# Patient Record
Sex: Male | Born: 1990 | Race: White | Hispanic: No | Marital: Single | Smoking: Never smoker
Health system: Southern US, Community
[De-identification: ages and names within clinical notes are randomized; demographics above are authoritative.]

## PROBLEM LIST (undated history)

## (undated) DIAGNOSIS — F909 Attention-deficit hyperactivity disorder, unspecified type: Secondary | ICD-10-CM

## (undated) HISTORY — PX: SHOULDER SURGERY: SHX246

---
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
## 2010-12-31 NOTE — Op Note (Signed)
NAMESASAN, WILKIE               ACCOUNT NO.:  [id number]   MEDICAL RECORD NO.:  [id number]          PATIENT TYPE:  AMB   LOCATION:  DSC                          FACILITY:  [facility]   PHYSICIAN:  Loreta Ave, M.D. DATE OF BIRTH:  Nov 22, 1990   DATE OF PROCEDURE:  08/17/2006  DATE OF DISCHARGE:  08/18/2006                               OPERATIVE REPORT   PREOPERATIVE DIAGNOSIS:  Right shoulder dislocation with anterior  instability and labral tear.   POSTOPERATIVE DIAGNOSIS:  Right shoulder dislocation with anterior  instability and labral tear, with anterior labrum tear, a chondral  impaction injury, chondral loose bodies and Bankart lesion.   PROCEDURE PERFORMED:  Right shoulder examination under anesthesia,  arthroscopy with debridement of labrum, removal of loose bodies and  chondroplasty of glenoid.  Reconstruction Bankart-type, with FiberWire  suture x2 and bioabsorbable knotless anchors x2.   SURGEON:  Loreta Ave, M.D.   ASSISTANT:  Lestine Mount, P.A., present throughout the entire case.   ANESTHESIA:  General.   ESTIMATED BLOOD LOSS:  Minimal.   SPECIMENS:  None.   CULTURES:  None.   COMPLICATIONS:  None.   DRESSINGS:  Soft compressive with shoulder immobilizer.   DESCRIPTION OF PROCEDURE:  The patient was brought to the operating room  and after adequate anesthesia had been obtained, both shoulders  examined.  On the right, he has anterior and anteroinferior instability,  moderate, not marked.  Increased external rotation.  Stable posteriorly.  Placed in a beach chair positioner on a shoulder positioner, prepped and  draped in the usual sterile fashion.  Anterior and posterior  arthroscopic portals.  Arthroscope introduced, shoulder distended and  inspected.  Rotator cuff intact with a little bit of irritation fraying  on the bottom side of the infraspinatus, debrided.  No significant  tears.  Biceps tendon, biceps anchor intact.  Front of the labrum  and  glenoid had obvious anterior vent with a Bankart lesion from 3 to 6  o'clock position, extending about a centimeter down the neck of the  glenoid.  Marked tearing and fraying, complex in nature, of the labrum,  all debrided back to a stable surface.  An impaction injury on the  articular cartilage of the glenoid, debrided, very focal, very small,  but chondral loose bodies removed.  Vast majority of articular cartilage  intact.  The front of the glenoid area was then roughened.  With the  Arthrex FiberLoop instrumentation, I then mobilized the capsule and  placed 2 FiberWire loops in the capsular labral interface, 1 at the 5  o'clock position and the other at the 3 o'clock position.  They were  then firmly fixed to the glenoid utilizing the bioabsorbable knotless  anchor system with the anchors drilled in the glenoid at the 5 and 3  o'clock position.  Loops attached to these and then firmly buried,  giving a nice, firm repair of the Bankart lesion back to the anatomic  position.  The entire shoulder re-examined with no other  significant findings appreciated.  Instruments and fluid removed.  Portals were closed with nylon.  Sterile  compressive dressing applied.  Shoulder immobilizer applied.  Anesthesia reversed.  Brought to recovery  room.  Tolerated the surgery well, no complications.      Loreta Ave, M.D.  Electronically Signed     DFM/MEDQ  D:  08/18/2006  T:  08/19/2006  Job:  [id number]

## 2021-01-04 ENCOUNTER — Other Ambulatory Visit: Payer: Self-pay

## 2021-01-04 ENCOUNTER — Emergency Department (HOSPITAL_BASED_OUTPATIENT_CLINIC_OR_DEPARTMENT_OTHER): Payer: BC Managed Care – PPO | Admitting: Radiology

## 2021-01-04 ENCOUNTER — Emergency Department (HOSPITAL_BASED_OUTPATIENT_CLINIC_OR_DEPARTMENT_OTHER)
Admission: EM | Admit: 2021-01-04 | Discharge: 2021-01-04 | Disposition: A | Discharge status: Home / Self Care | Payer: BC Managed Care – PPO | Attending: Emergency Medicine | Admitting: Emergency Medicine

## 2021-01-04 ENCOUNTER — Encounter (HOSPITAL_BASED_OUTPATIENT_CLINIC_OR_DEPARTMENT_OTHER): Payer: Self-pay | Admitting: Obstetrics and Gynecology

## 2021-01-04 DIAGNOSIS — W25XXXA Contact with sharp glass, initial encounter: Secondary | ICD-10-CM | POA: Insufficient documentation

## 2021-01-04 DIAGNOSIS — S51811A Laceration without foreign body of right forearm, initial encounter: Secondary | ICD-10-CM | POA: Diagnosis not present

## 2021-01-04 DIAGNOSIS — S59911A Unspecified injury of right forearm, initial encounter: Secondary | ICD-10-CM | POA: Diagnosis present

## 2021-01-04 HISTORY — DX: Attention-deficit hyperactivity disorder, unspecified type: F90.9

## 2021-01-04 MED ORDER — LIDOCAINE-EPINEPHRINE (PF) 2 %-1:200000 IJ SOLN
INTRAMUSCULAR | Status: AC
  Administered 2021-01-04: 20 mL via INTRADERMAL
  Filled 2021-01-04: qty 20

## 2021-01-04 MED ORDER — LIDOCAINE-EPINEPHRINE (PF) 2 %-1:200000 IJ SOLN: 20.0000 mL | Freq: Once | INTRAMUSCULAR | Status: AC

## 2021-01-04 MED ORDER — CEPHALEXIN 500 MG PO CAPS: 500.0000 mg | ORAL_CAPSULE | Freq: Four times a day (QID) | ORAL | 0 refills | Status: AC

## 2021-01-04 MED ORDER — CEPHALEXIN 250 MG PO CAPS
1000.0000 mg | ORAL_CAPSULE | Freq: Once | ORAL | Status: AC
  Administered 2021-01-04: 1000 mg via ORAL
  Filled 2021-01-04: qty 4
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
## 2022-05-26 ENCOUNTER — Other Ambulatory Visit (HOSPITAL_COMMUNITY): Payer: Self-pay

## 2022-05-26 MED ORDER — AMPHETAMINE-DEXTROAMPHETAMINE 20 MG PO TABS
20.0000 mg | ORAL_TABLET | Freq: Two times a day (BID) | ORAL | 0 refills | Status: DC
  Filled 2022-05-26: qty 60, 30d supply, fill #0

## 2022-06-27 ENCOUNTER — Other Ambulatory Visit (HOSPITAL_COMMUNITY): Payer: Self-pay

## 2022-06-27 MED ORDER — AMPHETAMINE-DEXTROAMPHETAMINE 20 MG PO TABS
20.0000 mg | ORAL_TABLET | Freq: Two times a day (BID) | ORAL | 0 refills | Status: AC
  Filled 2022-06-27 – 2022-06-28 (×2): qty 60, 30d supply, fill #0

## 2022-06-28 ENCOUNTER — Other Ambulatory Visit (HOSPITAL_COMMUNITY): Payer: Self-pay
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
## 2022-10-05 ENCOUNTER — Emergency Department (HOSPITAL_BASED_OUTPATIENT_CLINIC_OR_DEPARTMENT_OTHER)
Admission: EM | Admit: 2022-10-05 | Discharge: 2022-10-05 | Disposition: A | Discharge status: Home / Self Care | Payer: Self-pay | Attending: Emergency Medicine | Admitting: Emergency Medicine

## 2022-10-05 ENCOUNTER — Other Ambulatory Visit: Payer: Self-pay

## 2022-10-05 ENCOUNTER — Encounter (HOSPITAL_BASED_OUTPATIENT_CLINIC_OR_DEPARTMENT_OTHER): Payer: Self-pay

## 2022-10-05 ENCOUNTER — Emergency Department (HOSPITAL_BASED_OUTPATIENT_CLINIC_OR_DEPARTMENT_OTHER): Payer: Self-pay

## 2022-10-05 DIAGNOSIS — R2232 Localized swelling, mass and lump, left upper limb: Secondary | ICD-10-CM | POA: Insufficient documentation

## 2022-10-05 DIAGNOSIS — S01111A Laceration without foreign body of right eyelid and periocular area, initial encounter: Secondary | ICD-10-CM | POA: Insufficient documentation

## 2022-10-05 DIAGNOSIS — M7989 Other specified soft tissue disorders: Secondary | ICD-10-CM

## 2022-10-05 DIAGNOSIS — S0181XA Laceration without foreign body of other part of head, initial encounter: Secondary | ICD-10-CM

## 2022-10-05 NOTE — ED Triage Notes (Signed)
Sts TDAP UTD within 5 yrs

## 2022-10-05 NOTE — Discharge Instructions (Signed)
Return for redness drainage or if you get a fever.   You can apply an ointment a couple times a day this could be as simple as Vaseline but could also be an antibiotic ointment if you wish.  Once it is healed please try to avoid prolonged sun exposure use sunscreen.  Gells that have silicone antigens have been shown to reduce scarring in some research.
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
